# Patient Record
Sex: Male | Born: 1985
Health system: Southern US, Community
[De-identification: ages and names within clinical notes are randomized; demographics above are authoritative.]

## PROBLEM LIST (undated history)

## (undated) DIAGNOSIS — I429 Cardiomyopathy, unspecified: Secondary | ICD-10-CM

## (undated) DIAGNOSIS — R011 Cardiac murmur, unspecified: Secondary | ICD-10-CM

---

## 2015-11-13 ENCOUNTER — Encounter (HOSPITAL_COMMUNITY): Payer: Self-pay | Admitting: Emergency Medicine

## 2015-11-13 ENCOUNTER — Emergency Department (HOSPITAL_COMMUNITY)
Admission: EM | Admit: 2015-11-13 | Discharge: 2015-11-13 | Disposition: A | Discharge status: Home / Self Care | Payer: Medicaid - Out of State | Attending: Emergency Medicine | Admitting: Emergency Medicine

## 2015-11-13 DIAGNOSIS — R109 Unspecified abdominal pain: Secondary | ICD-10-CM

## 2015-11-13 DIAGNOSIS — F17210 Nicotine dependence, cigarettes, uncomplicated: Secondary | ICD-10-CM | POA: Insufficient documentation

## 2015-11-13 DIAGNOSIS — R1011 Right upper quadrant pain: Secondary | ICD-10-CM | POA: Insufficient documentation

## 2015-11-13 LAB — COMPREHENSIVE METABOLIC PANEL
ALK PHOS: 77 U/L (ref 38–126)
ALT: 16 U/L — ABNORMAL LOW (ref 17–63)
AST: 21 U/L (ref 15–41)
Albumin: 4.2 g/dL (ref 3.5–5.0)
Anion gap: 7 (ref 5–15)
BILIRUBIN TOTAL: 0.5 mg/dL (ref 0.3–1.2)
BUN: 12 mg/dL (ref 6–20)
CALCIUM: 8.7 mg/dL — AB (ref 8.9–10.3)
CO2: 28 mmol/L (ref 22–32)
CREATININE: 0.93 mg/dL (ref 0.61–1.24)
Chloride: 104 mmol/L (ref 101–111)
GFR calc Af Amer: >60 mL/min (ref >60)
GLUCOSE: 93 mg/dL (ref 65–99)
POTASSIUM: 4.4 mmol/L (ref 3.5–5.1)
Sodium: 139 mmol/L (ref 135–145)
TOTAL PROTEIN: 7.4 g/dL (ref 6.5–8.1)

## 2015-11-13 LAB — CBC
HCT: 44.4 % (ref 39.0–52.0)
Hemoglobin: 15.1 g/dL (ref 13.0–17.0)
MCH: 31.1 pg (ref 26.0–34.0)
MCHC: 34 g/dL (ref 30.0–36.0)
MCV: 91.5 fL (ref 78.0–100.0)
PLATELETS: 239 10*3/uL (ref 150–400)
RBC: 4.85 MIL/uL (ref 4.22–5.81)
RDW: 13.2 % (ref 11.5–15.5)
WBC: 4.4 10*3/uL (ref 4.0–10.5)

## 2015-11-13 LAB — URINALYSIS, ROUTINE W REFLEX MICROSCOPIC
Glucose, UA: NEGATIVE mg/dL
Hgb urine dipstick: NEGATIVE
KETONES UR: 15 mg/dL — AB
LEUKOCYTES UA: NEGATIVE
NITRITE: NEGATIVE
PROTEIN: NEGATIVE mg/dL
Specific Gravity, Urine: 1.015 (ref 1.005–1.030)
pH: 6 (ref 5.0–8.0)

## 2015-11-13 LAB — LIPASE, BLOOD: Lipase: 30 U/L (ref 11–51)

## 2015-11-13 MED ORDER — PANTOPRAZOLE SODIUM 40 MG PO TBEC
40.0000 mg | DELAYED_RELEASE_TABLET | Freq: Once | ORAL | Status: AC
  Administered 2015-11-13: 40 mg via ORAL
  Filled 2015-11-13: qty 1

## 2015-11-13 MED ORDER — PROMETHAZINE HCL 25 MG PO TABS: 25.0000 mg | ORAL_TABLET | Freq: Four times a day (QID) | ORAL | Status: AC | PRN

## 2015-11-13 MED ORDER — OMEPRAZOLE 20 MG PO CPDR: 20.0000 mg | DELAYED_RELEASE_CAPSULE | Freq: Every day | ORAL | Status: AC

## 2015-11-13 NOTE — ED Notes (Signed)
Pt c/o abd pain/ha/chills/constipation. Denies vomiting.

## 2015-11-13 NOTE — ED Notes (Signed)
Pt tolerating PO fluids

## 2015-11-13 NOTE — Discharge Instructions (Signed)

## 2015-11-13 NOTE — ED Provider Notes (Signed)
CSN: [id number]     Arrival date & time 11/13/15  1450 History   First MD Initiated Contact with Patient 11/13/15 1858     Chief Complaint  Patient presents with  . Abdominal Pain   HPI Patient presents to the emergency room for evaluation of upper abdominal pain. Symptoms initially started couple days ago with some URI symptoms cough nausea or headache. He also started having some chills and thought he was having a fever. Those symptoms resolved over the last couple of days but then he started having trouble with pain in his upper abdomen. Primarily sharp and also in the right upper quadrant. He denies any recent nausea or vomiting. He denies any dysuria or diarrhea. History reviewed. No pertinent past medical history. History reviewed. No pertinent past surgical history. No family history on file. Social History  Substance Use Topics  . Smoking status: Current Every Day Smoker -- 1.00 packs/day    Types: Cigarettes  . Smokeless tobacco: None  . Alcohol Use: No    Review of Systems  All other systems reviewed and are negative.     Allergies  Actamin; Dimetapp multisymptom cold-flu; and Robitussin (alcohol free)  Home Medications   Prior to Admission medications   Medication Sig Start Date End Date Taking? Authorizing Provider  naproxen sodium (ANAPROX) 220 MG tablet Take 440 mg by mouth daily as needed (for headache/pain).   Yes Historical Provider, MD  omeprazole (PRILOSEC) 20 MG capsule Take 1 capsule (20 mg total) by mouth daily. 11/13/15   Linwood DibblesJon Promise Bushong, MD  promethazine (PHENERGAN) 25 MG tablet Take 1 tablet (25 mg total) by mouth every 6 (six) hours as needed for nausea or vomiting. 11/13/15   Linwood DibblesJon Mayola Mcbain, MD   BP 132/75 mmHg  Pulse 84  Temp(Src) 98.8 F (37.1 C) (Oral)  Resp 18  Ht 6' (1.829 m)  Wt 77.111 kg  BMI 23.05 kg/m2  SpO2 99% Physical Exam  Constitutional: He appears well-developed and well-nourished. No distress.  HENT:  Head: Normocephalic and atraumatic.   Right Ear: External ear normal.  Left Ear: External ear normal.  Mouth/Throat: No oropharyngeal exudate (mild pharyngeal erythema).  No sinus tenderness, normal tympanic membranes bilaterally  Eyes: Conjunctivae are normal. Right eye exhibits no discharge. Left eye exhibits no discharge. No scleral icterus.  Neck: Neck supple. No tracheal deviation present.  Cardiovascular: Normal rate, regular rhythm and intact distal pulses.   Pulmonary/Chest: Effort normal and breath sounds normal. No stridor. No respiratory distress. He has no wheezes. He has no rales.  Abdominal: Soft. Bowel sounds are normal. He exhibits no distension. There is no tenderness. There is no rebound and no guarding.  Musculoskeletal: He exhibits no edema or tenderness.  Neurological: He is alert. He has normal strength. No cranial nerve deficit (no facial droop, extraocular movements intact, no slurred speech) or sensory deficit. He exhibits normal muscle tone. He displays no seizure activity. Coordination normal.  Skin: Skin is warm and dry. No rash noted.  Psychiatric: He has a normal mood and affect.  Nursing note and vitals reviewed.   ED Course  Procedures (including critical care time) Labs Review Labs Reviewed  COMPREHENSIVE METABOLIC PANEL - Abnormal; Notable for the following:    Calcium 8.7 (*)    ALT 16 (*)    All other components within normal limits  URINALYSIS, ROUTINE W REFLEX MICROSCOPIC (NOT [facility]) - Abnormal; Notable for the following:    Bilirubin Urine SMALL (*)    Ketones, ur 15 (*)  All other components within normal limits  LIPASE, BLOOD  CBC    Imaging Review No results found. I have personally reviewed and evaluated these images and lab results as part of my medical decision-making.   EKG Interpretation None      MDM   Final diagnoses:  Abdominal pain, unspecified abdominal location    Patient's abdominal exam is benign. He has no focal tenderness on exam. His laboratory  tests are reassuring. Symptoms are suggestive of a viral illness. Plan on discharge home with antacids and antinausea medications. Follow-up with primary care doctor. Return as needed for worsening symptoms.    Linwood Dibbles, MD 11/13/15 2041

## 2015-11-13 NOTE — ED Notes (Signed)
Pt reports 6 hours of right sided pain and RUQ pain. Pt denies N/V. Pt tolerating PO and denies any needs at this time

## 2017-12-12 ENCOUNTER — Emergency Department (HOSPITAL_COMMUNITY)
Admission: EM | Admit: 2017-12-12 | Discharge: 2017-12-12 | Disposition: A | Discharge status: Home / Self Care | Payer: Self-pay | Attending: Emergency Medicine | Admitting: Emergency Medicine

## 2017-12-12 ENCOUNTER — Emergency Department (HOSPITAL_COMMUNITY): Payer: Self-pay

## 2017-12-12 ENCOUNTER — Encounter (HOSPITAL_COMMUNITY): Payer: Self-pay | Admitting: Emergency Medicine

## 2017-12-12 ENCOUNTER — Other Ambulatory Visit: Payer: Self-pay

## 2017-12-12 DIAGNOSIS — R197 Diarrhea, unspecified: Secondary | ICD-10-CM | POA: Insufficient documentation

## 2017-12-12 DIAGNOSIS — F17210 Nicotine dependence, cigarettes, uncomplicated: Secondary | ICD-10-CM | POA: Insufficient documentation

## 2017-12-12 DIAGNOSIS — Y939 Activity, unspecified: Secondary | ICD-10-CM | POA: Insufficient documentation

## 2017-12-12 DIAGNOSIS — S300XXA Contusion of lower back and pelvis, initial encounter: Secondary | ICD-10-CM

## 2017-12-12 DIAGNOSIS — Y998 Other external cause status: Secondary | ICD-10-CM | POA: Insufficient documentation

## 2017-12-12 DIAGNOSIS — W010XXA Fall on same level from slipping, tripping and stumbling without subsequent striking against object, initial encounter: Secondary | ICD-10-CM | POA: Insufficient documentation

## 2017-12-12 DIAGNOSIS — R112 Nausea with vomiting, unspecified: Secondary | ICD-10-CM

## 2017-12-12 DIAGNOSIS — I429 Cardiomyopathy, unspecified: Secondary | ICD-10-CM | POA: Insufficient documentation

## 2017-12-12 DIAGNOSIS — Y929 Unspecified place or not applicable: Secondary | ICD-10-CM | POA: Insufficient documentation

## 2017-12-12 HISTORY — DX: Cardiac murmur, unspecified: R01.1

## 2017-12-12 HISTORY — DX: Cardiomyopathy, unspecified: I42.9

## 2017-12-12 LAB — URINALYSIS, ROUTINE W REFLEX MICROSCOPIC
Bilirubin Urine: NEGATIVE
Glucose, UA: NEGATIVE mg/dL
Hgb urine dipstick: NEGATIVE
KETONES UR: NEGATIVE mg/dL
Leukocytes, UA: NEGATIVE
NITRITE: NEGATIVE
PH: 6 (ref 5.0–8.0)
Protein, ur: NEGATIVE mg/dL
SPECIFIC GRAVITY, URINE: 1.011 (ref 1.005–1.030)

## 2017-12-12 LAB — COMPREHENSIVE METABOLIC PANEL
ALBUMIN: 4.3 g/dL (ref 3.5–5.0)
ALT: 8 U/L — ABNORMAL LOW (ref 17–63)
ANION GAP: 10 (ref 5–15)
AST: 15 U/L (ref 15–41)
Alkaline Phosphatase: 99 U/L (ref 38–126)
BUN: 11 mg/dL (ref 6–20)
CO2: 27 mmol/L (ref 22–32)
Calcium: 9.2 mg/dL (ref 8.9–10.3)
Chloride: 101 mmol/L (ref 101–111)
Creatinine, Ser: 0.97 mg/dL (ref 0.61–1.24)
GFR calc Af Amer: >60 mL/min (ref >60)
GFR calc non Af Amer: >60 mL/min (ref >60)
GLUCOSE: 90 mg/dL (ref 65–99)
POTASSIUM: 3.4 mmol/L — AB (ref 3.5–5.1)
Sodium: 138 mmol/L (ref 135–145)
TOTAL PROTEIN: 7.3 g/dL (ref 6.5–8.1)
Total Bilirubin: 1.4 mg/dL — ABNORMAL HIGH (ref 0.3–1.2)

## 2017-12-12 LAB — CBC WITH DIFFERENTIAL/PLATELET
BASOS PCT: 1 %
Basophils Absolute: 0.1 10*3/uL (ref 0.0–0.1)
Eosinophils Absolute: 0.3 10*3/uL (ref 0.0–0.7)
Eosinophils Relative: 6 %
HEMATOCRIT: 47.1 % (ref 39.0–52.0)
HEMOGLOBIN: 15.8 g/dL (ref 13.0–17.0)
Lymphocytes Relative: 17 %
Lymphs Abs: 0.9 10*3/uL (ref 0.7–4.0)
MCH: 31.3 pg (ref 26.0–34.0)
MCHC: 33.5 g/dL (ref 30.0–36.0)
MCV: 93.3 fL (ref 78.0–100.0)
MONOS PCT: 11 %
Monocytes Absolute: 0.6 10*3/uL (ref 0.1–1.0)
NEUTROS ABS: 3.6 10*3/uL (ref 1.7–7.7)
NEUTROS PCT: 65 %
Platelets: 284 10*3/uL (ref 150–400)
RBC: 5.05 MIL/uL (ref 4.22–5.81)
RDW: 13.3 % (ref 11.5–15.5)
WBC: 5.6 10*3/uL (ref 4.0–10.5)

## 2017-12-12 LAB — LIPASE, BLOOD: Lipase: 25 U/L (ref 11–51)

## 2017-12-12 LAB — TROPONIN I: Troponin I: <0.03 ng/mL (ref <0.03)

## 2017-12-12 MED ORDER — ONDANSETRON 8 MG PO TBDP
8.0000 mg | ORAL_TABLET | Freq: Once | ORAL | Status: AC
  Administered 2017-12-12: 8 mg via ORAL
  Filled 2017-12-12: qty 1

## 2017-12-12 MED ORDER — ONDANSETRON HCL 8 MG PO TABS: 4.0000 mg | ORAL_TABLET | Freq: Four times a day (QID) | ORAL | 0 refills | Status: AC

## 2017-12-12 NOTE — Discharge Instructions (Signed)
Frequent, small sips of clear fluids for 1 to 2 days, then bland diet as tolerated.  You may alternate with ice and heat to your lower back.  You may contact 1 of the clinics listed to establish primary care.  Return to the ER for any worsening symptoms such as fever, persistent vomiting or diarrhea numbness or weakness.

## 2017-12-12 NOTE — ED Notes (Signed)
Pt states he does not need to urinate at this time, aware of DO. Refused I&O cath

## 2017-12-12 NOTE — ED Notes (Addendum)
Urine spec provided  Pt reports he has no physician as he is texting on cell phone  Education: [facility], [facility], [place] as well as black box infor on his DC paperwork for finding a physician

## 2017-12-12 NOTE — ED Provider Notes (Signed)
[facility] EMERGENCY DEPARTMENT Provider Note   CSN: [id number] Arrival date & time: 12/12/17  4098     History   Chief Complaint Chief Complaint  Patient presents with  . Chest Pain  . Abdominal Pain    HPI Kevin Mcintosh is a 32 y.o. male.  HPI  Kevin Mcintosh is a 32 y.o. male who presents to the Emergency Department complaining of chest, abdominal pain and nausea, vomiting and diarrhea.  Symptoms present for one week.  Pt reports multiple episodes of vomiting and diarrhea.  Also complains of generalized weakness and states that he fell this morning, striking his lower back of the floor.  No syncope or dizziness. Chest and abdominal pain onset after the vomiting and diarrhea.  States that vomiting and diarrhea has improved and he states that he is now tolerating liquids.  Symptoms began after eating [place] and he is concerned that he may have food poisoning.  He denies urine changes, fever, chills, shortness of breath,dark or bloody stools.  No recent abx use. No numbness, pain or weakness of the lower extremities, urine or bowel rentention or incontinence.     Past Medical History:  Diagnosis Date  . Cardiomyopathy (HCC)   . Heart murmur     There are no active problems to display for this patient.   History reviewed. No pertinent surgical history.    Home Medications    Prior to Admission medications   Medication Sig Start Date End Date Taking? Authorizing Provider  naproxen sodium (ANAPROX) 220 MG tablet Take 440 mg by mouth daily as needed (for headache/pain).    [provider]  omeprazole (PRILOSEC) 20 MG capsule Take 1 capsule (20 mg total) by mouth daily. 11/13/15   Linwood Dibbles, MD  promethazine (PHENERGAN) 25 MG tablet Take 1 tablet (25 mg total) by mouth every 6 (six) hours as needed for nausea or vomiting. 11/13/15   Linwood Dibbles, MD    Family History History reviewed. No pertinent family history.  Social History Social History   Tobacco Use  .  Smoking status: Current Every Day Smoker    Packs/day: 0.50    Types: Cigarettes  . Smokeless tobacco: Never Used  Substance Use Topics  . Alcohol use: No  . Drug use: No     Allergies   Actamin [acetaminophen]; Dimetapp multisymptom cold-flu [phenyleph-cpm-dm-apap]; and Robitussin (alcohol free) [guaifenesin]   Review of Systems Review of Systems  Constitutional: Negative for appetite change, chills and fever.  Eyes: Negative for visual disturbance.  Respiratory: Positive for wheezing. Negative for chest tightness and shortness of breath.   Cardiovascular: Negative for chest pain.  Gastrointestinal: Positive for abdominal pain, diarrhea, nausea and vomiting. Negative for blood in stool.  Genitourinary: Negative for decreased urine volume, difficulty urinating, dysuria and flank pain.  Musculoskeletal: Positive for back pain. Negative for neck pain.  Skin: Negative for color change and rash.  Neurological: Negative for dizziness, weakness, numbness and headaches.  Hematological: Negative for adenopathy.  Psychiatric/Behavioral: Negative for confusion.  All other systems reviewed and are negative.    Physical Exam Updated Vital Signs BP 138/85 (BP Location: Left Arm)   Pulse 97   Temp 98.4 F (36.9 C) (Oral)   Resp 11   Ht 6' (1.829 m)   Wt 74.8 kg (165 lb)   SpO2 99%   BMI 22.38 kg/m   Physical Exam  Constitutional: He is oriented to person, place, and time. He appears well-developed and well-nourished. No distress.  HENT:  Head: Normocephalic and atraumatic.  Mouth/Throat: Uvula is midline and oropharynx is clear and moist. Mucous membranes are dry.  Cardiovascular: Normal rate, regular rhythm and intact distal pulses.  No murmur heard. Pulmonary/Chest: Effort normal and breath sounds normal. No respiratory distress.  Abdominal: Soft. Bowel sounds are normal. He exhibits no distension and no mass. There is no tenderness. There is no rebound and no guarding.    Musculoskeletal: Normal range of motion. He exhibits no edema.  Diffuse ttp of the lower lumbar spine and bilateral paraspinal muscles.  Neg SLR bilaterally.  No bony step offs.   Neurological: He is alert and oriented to person, place, and time. No sensory deficit. He exhibits normal muscle tone. Coordination normal.  Skin: Skin is warm and dry. Capillary refill takes less than 2 seconds. No rash noted.  Psychiatric: He has a normal mood and affect.  Nursing note and vitals reviewed.    ED Treatments / Results  Labs (all labs ordered are listed, but only abnormal results are displayed) Labs Reviewed  COMPREHENSIVE METABOLIC PANEL - Abnormal; Notable for the following components:      Result Value   Potassium 3.4 (*)    ALT 8 (*)    Total Bilirubin 1.4 (*)    All other components within normal limits  LIPASE, BLOOD  CBC WITH DIFFERENTIAL/PLATELET  TROPONIN I  URINALYSIS, ROUTINE W REFLEX MICROSCOPIC    EKG ED ECG REPORT   Date: 12/12/2017  Rate: 83  Rhythm: normal sinus rhythm  QRS Axis:   Intervals: normal  ST/T Wave abnormalities:   Conduction Disutrbances:  Narrative Interpretation:   Old EKG Reviewed: none available  EKG reviewed by Dr. Adriana Simas   Radiology Dg Chest 2 View  Result Date: 12/12/2017 CLINICAL DATA:  Chest pain.  Vomiting. EXAM: CHEST - 2 VIEW COMPARISON:  No recent prior. FINDINGS: Mediastinum and hilar structures normal. Lungs are clear. Tiny calcified pulmonary nodules consistent granulomas. No pleural effusion or pneumothorax. Cardiomegaly with normal pulmonary vascularity. IMPRESSION: 1. Tiny calcified pulmonary nodules consistent with granulomas. No acute infiltrate. 2.  Cardiomegaly with normal pulmonary vascularity. Electronically Signed   By: Maisie Fus  Register   On: 12/12/2017 10:34   Dg Lumbar Spine Complete  Result Date: 12/12/2017 CLINICAL DATA:  Lumbago.  Recent fall EXAM: LUMBAR SPINE - COMPLETE 4+ VIEW COMPARISON:  None. FINDINGS: Frontal,  lateral, spot lumbosacral lateral, and bilateral oblique views were obtained. There are 5 non-rib-bearing lumbar type vertebral bodies. T12 ribs are hypoplastic. There is no fracture or spondylolisthesis. Disc spaces appear normal. There is no appreciable facet arthropathy. IMPRESSION: No fracture or spondylolisthesis.  No appreciable arthropathy. Electronically Signed   By: Bretta Bang III M.D.   On: 12/12/2017 10:34    Procedures Procedures (including critical care time)  Medications Ordered in ED Medications  ondansetron (ZOFRAN-ODT) disintegrating tablet 8 mg (8 mg Oral Given 12/12/17 1004)     Initial Impression / Assessment and Plan / ED Course  I have reviewed the triage vital signs and the nursing notes.  Pertinent labs & imaging results that were available during my care of the patient were reviewed by me and considered in my medical decision making (see chart for details).     Pt with hx of multiple episodes of vomiting and diarrhea.  Mucus membranes are dry, pt is non-toxic appearing.  No abdominal pain on exam.  Recommended IVF's but pt declines stating that he is afraid of needles and prefers to rehydrate with po fluids.  Explained  benefits of IV fluids, but pt continues to decline.  Agrees to allow labs.  Sx's are felt to be viral.  On recheck, labs are reassuring.  Vitals reviewed.  abd remains soft, non-tender and pt has tolerated po fluids and he is requesting d/c.  Appears stable and return precautions discussed.  Pt is ambulatory, no focal neuro deficits.  Doubt cauda equina.    Final Clinical Impressions(s) / ED Diagnoses   Final diagnoses:  Nausea vomiting and diarrhea  Lumbar contusion, initial encounter    ED Discharge Orders    None       Pauline Aus, PA-C 12/12/17 2354    Donnetta Hutching, MD 12/13/17 [id number]

## 2017-12-12 NOTE — ED Notes (Signed)
Pt refused IV  

## 2017-12-12 NOTE — ED Triage Notes (Signed)
Pt reports mid centralized CP for over a week with no radiation. Also has been having abd pain with N/V/D/ for over a week. Emesis X2 and D/ X1 today. Denies blood in stool.

## 2019-01-15 DIAGNOSIS — R1030 Lower abdominal pain, unspecified: Secondary | ICD-10-CM | POA: Diagnosis not present

## 2019-01-15 DIAGNOSIS — F17210 Nicotine dependence, cigarettes, uncomplicated: Secondary | ICD-10-CM | POA: Diagnosis not present

## 2019-01-15 DIAGNOSIS — Z716 Tobacco abuse counseling: Secondary | ICD-10-CM | POA: Diagnosis not present

## 2019-01-18 ENCOUNTER — Other Ambulatory Visit: Payer: Self-pay | Admitting: Family Medicine

## 2019-01-18 DIAGNOSIS — R1084 Generalized abdominal pain: Secondary | ICD-10-CM

## 2019-01-22 ENCOUNTER — Other Ambulatory Visit: Payer: Self-pay

## 2019-01-22 ENCOUNTER — Ambulatory Visit (HOSPITAL_COMMUNITY)
Admission: RE | Admit: 2019-01-22 | Discharge: 2019-01-22 | Disposition: A | Discharge status: Home / Self Care | Payer: BC Managed Care – PPO | Source: Ambulatory Visit | Attending: Family Medicine | Admitting: Family Medicine

## 2019-01-22 ENCOUNTER — Inpatient Hospital Stay (HOSPITAL_COMMUNITY): Admission: RE | Admit: 2019-01-22 | Payer: Self-pay | Source: Ambulatory Visit

## 2019-01-22 ENCOUNTER — Other Ambulatory Visit: Payer: Self-pay | Admitting: Family Medicine

## 2019-01-22 DIAGNOSIS — R1084 Generalized abdominal pain: Secondary | ICD-10-CM | POA: Insufficient documentation

## 2019-01-22 DIAGNOSIS — R1030 Lower abdominal pain, unspecified: Secondary | ICD-10-CM | POA: Diagnosis not present

## 2019-02-09 DIAGNOSIS — R1031 Right lower quadrant pain: Secondary | ICD-10-CM | POA: Diagnosis not present

## 2019-02-16 DIAGNOSIS — K4090 Unilateral inguinal hernia, without obstruction or gangrene, not specified as recurrent: Secondary | ICD-10-CM | POA: Diagnosis not present

## 2019-02-16 DIAGNOSIS — R102 Pelvic and perineal pain: Secondary | ICD-10-CM | POA: Diagnosis not present

## 2019-03-08 DIAGNOSIS — R102 Pelvic and perineal pain: Secondary | ICD-10-CM | POA: Diagnosis not present

## 2019-03-08 DIAGNOSIS — R1030 Lower abdominal pain, unspecified: Secondary | ICD-10-CM | POA: Diagnosis not present

## 2019-03-16 DIAGNOSIS — R102 Pelvic and perineal pain: Secondary | ICD-10-CM | POA: Diagnosis not present

## 2019-03-16 DIAGNOSIS — K4090 Unilateral inguinal hernia, without obstruction or gangrene, not specified as recurrent: Secondary | ICD-10-CM | POA: Diagnosis not present

## 2019-05-26 DIAGNOSIS — F4325 Adjustment disorder with mixed disturbance of emotions and conduct: Secondary | ICD-10-CM | POA: Diagnosis not present

## 2019-08-19 DIAGNOSIS — K047 Periapical abscess without sinus: Secondary | ICD-10-CM | POA: Diagnosis not present

## 2019-10-27 DIAGNOSIS — Z23 Encounter for immunization: Secondary | ICD-10-CM | POA: Diagnosis not present

## 2019-11-24 DIAGNOSIS — Z23 Encounter for immunization: Secondary | ICD-10-CM | POA: Diagnosis not present

## 2021-04-11 IMAGING — US US PELVIS LIMITED
1 series · 9 of 9 positions shown · non-contrast
Comparison: None

CLINICAL DATA: RIGHT inguinal pain especially with lifting, does
heavy lifting at job, question RIGHT inguinal hernia

EXAM:
LIMITED ULTRASOUND OF PELVIS
TECHNIQUE: Limited transabdominal ultrasound examination of the pelvis was
performed at the site of symptoms at the RIGHT inguinal canal.

[Series 1: us pelvis limited · 9 acquisitions, 9 frames shown]
[im 1/9]
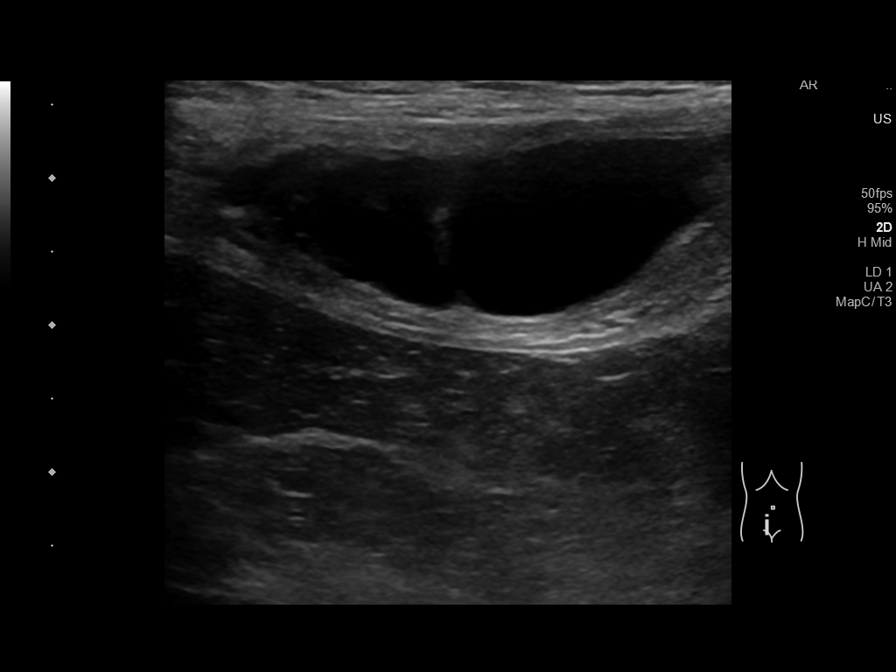
[im 2/9]
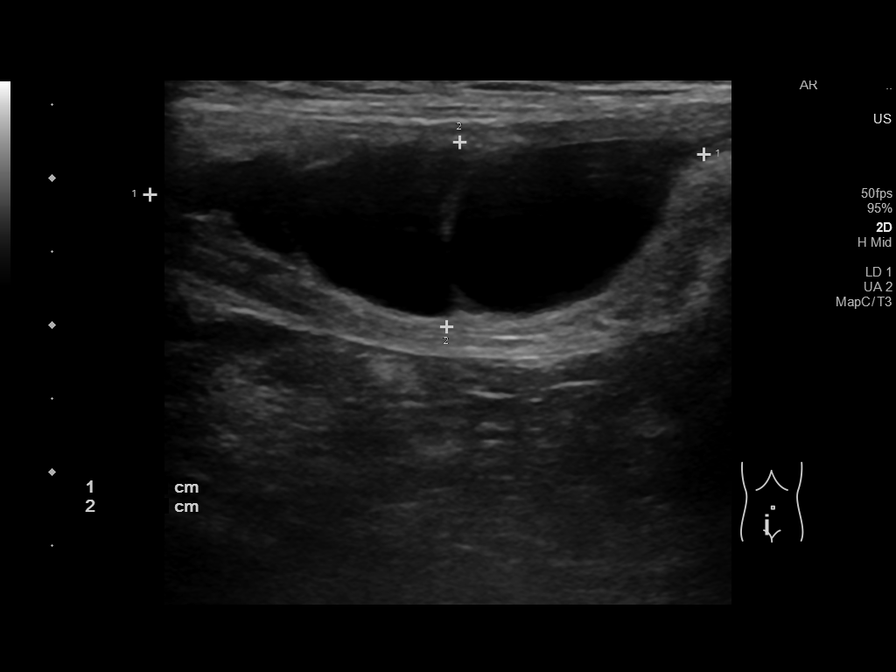
[im 3/9]
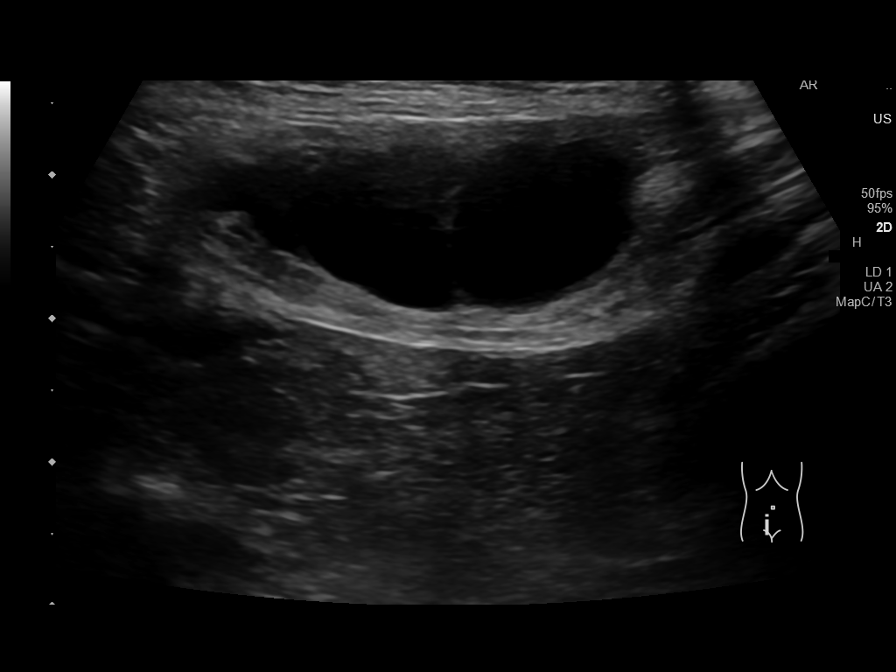
[im 4/9]
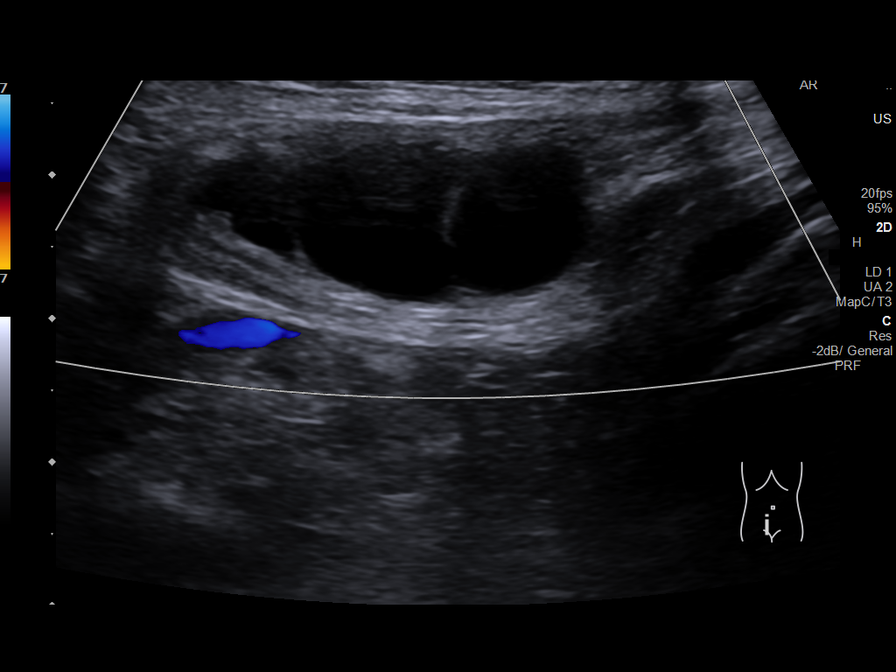
[im 5/9]
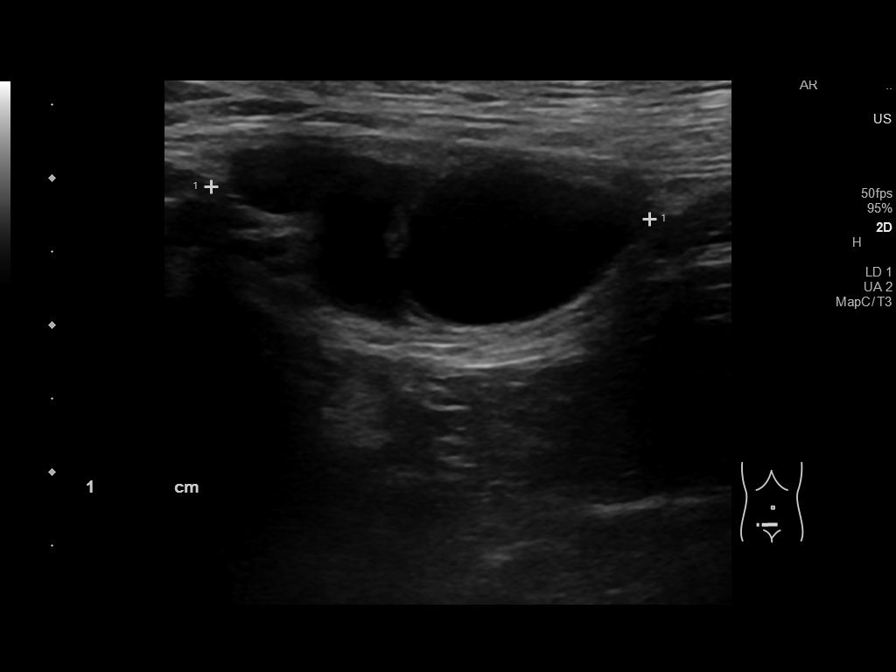
[im 6/9]
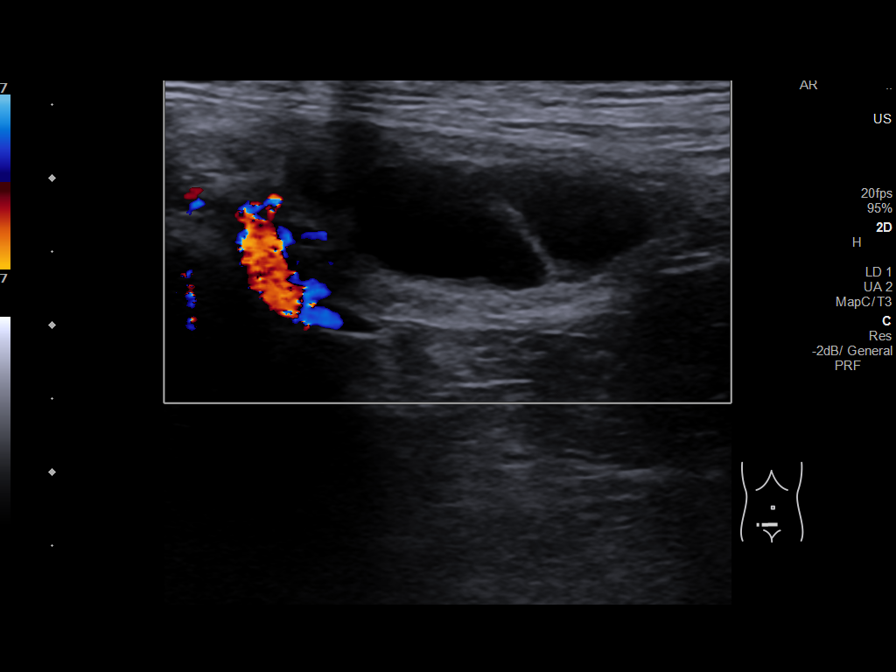
[im 7/9]
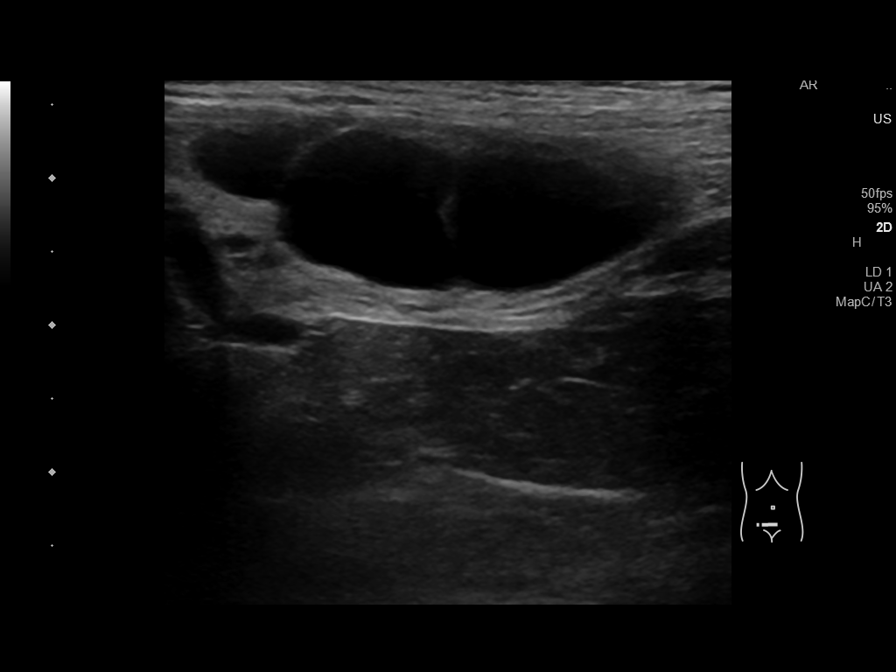
[im 8/9]
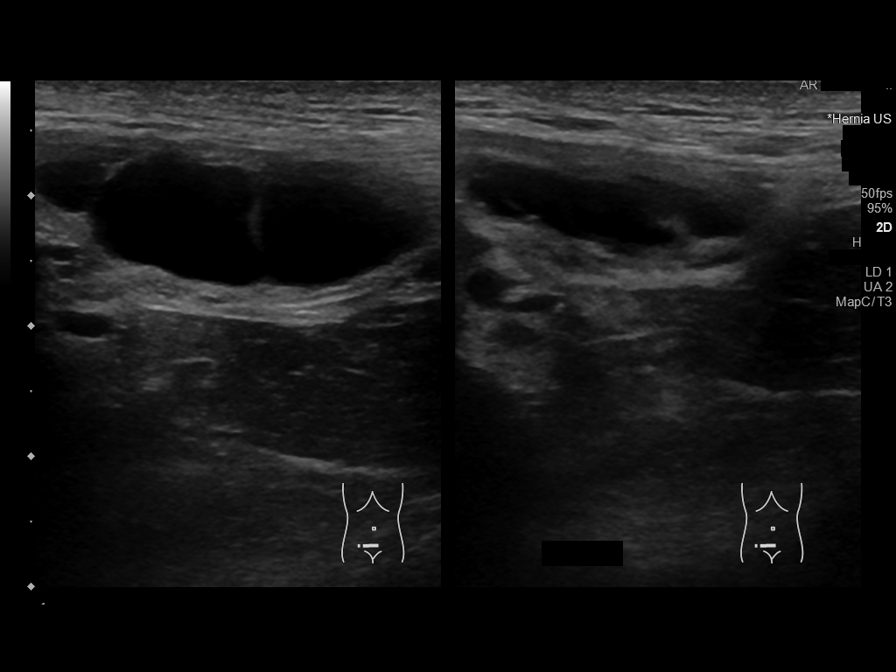
[im 9/9]
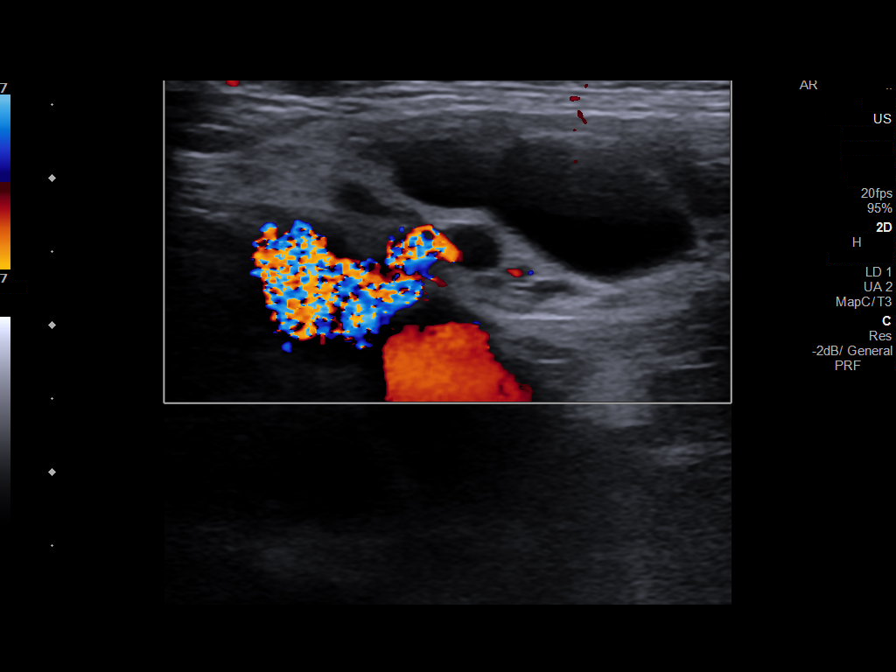

[9 of 9 positions shown; findings below may reference images not displayed]

FINDINGS: At the RIGHT inguinal canal, a loculated mildly complicated oblong
fluid collection is identified measuring 3.8 x 3.0 x 1.3 cm. This
contains several small thin septations. No internal blood flow or
surrounding blood flow on color Doppler imaging. No mural nodularity
or solid mass. No bowel or fat herniation is seen along the
spermatic cord. No hydrocele of the RIGHT hemiscrotum. No mass or
adenopathy.
IMPRESSION: Ovoid loculated fluid collection of the RIGHT inguinal canal 3.8 x
3.0 x 1.3 cm in size.

Differential diagnosis would include hydrocele of the inguinal
canal, lymphocele, or seroma.

No definite inguinal hernia or fat/bowel herniation identified.
